# Patient Record
Sex: Male | Born: 1975 | Race: White | Hispanic: No | Marital: Married | Smoking: Current every day smoker
Health system: Southern US, Community
[De-identification: ages and names within clinical notes are randomized; demographics above are authoritative.]

## PROBLEM LIST (undated history)

## (undated) DIAGNOSIS — M549 Dorsalgia, unspecified: Secondary | ICD-10-CM

## (undated) HISTORY — PX: APPENDECTOMY: SHX54

## (undated) HISTORY — PX: SHOULDER SURGERY: SHX246

## (undated) HISTORY — PX: HERNIA REPAIR: SHX51

---

## 1997-11-30 ENCOUNTER — Emergency Department (HOSPITAL_COMMUNITY): Admission: EM | Admit: 1997-11-30 | Discharge: 1997-11-30 | Payer: Self-pay | Admitting: Emergency Medicine

## 2006-02-08 ENCOUNTER — Ambulatory Visit: Payer: Self-pay | Admitting: Family Medicine

## 2006-09-27 ENCOUNTER — Emergency Department (HOSPITAL_COMMUNITY): Admission: EM | Admit: 2006-09-27 | Discharge: 2006-09-27 | Payer: Self-pay | Admitting: Emergency Medicine

## 2006-09-27 ENCOUNTER — Emergency Department (HOSPITAL_COMMUNITY): Admission: EM | Admit: 2006-09-27 | Discharge: 2006-09-27 | Payer: Self-pay

## 2006-10-17 ENCOUNTER — Ambulatory Visit (HOSPITAL_COMMUNITY): Admission: RE | Admit: 2006-10-17 | Discharge: 2006-10-17 | Payer: Self-pay | Admitting: Orthopaedic Surgery

## 2006-12-21 ENCOUNTER — Ambulatory Visit (HOSPITAL_COMMUNITY): Admission: RE | Admit: 2006-12-21 | Discharge: 2006-12-21 | Payer: Self-pay | Admitting: Orthopaedic Surgery

## 2010-04-24 ENCOUNTER — Encounter: Payer: Self-pay | Admitting: Orthopaedic Surgery

## 2011-01-12 LAB — CBC
MCHC: 34.6
RDW: 12.7

## 2011-01-12 LAB — COMPREHENSIVE METABOLIC PANEL
ALT: 35
AST: 23
Calcium: 9.6
GFR calc Af Amer: >60
Sodium: 138
Total Protein: 6.7

## 2011-01-18 LAB — URINALYSIS, ROUTINE W REFLEX MICROSCOPIC
Bilirubin Urine: NEGATIVE
Glucose, UA: NEGATIVE
Ketones, ur: NEGATIVE
pH: 5.5

## 2011-01-18 LAB — I-STAT 8, (EC8 V) (CONVERTED LAB)
BUN: 23
Chloride: 113 — ABNORMAL HIGH
Glucose, Bld: 83
Hemoglobin: 13.3
Potassium: 5.6 — ABNORMAL HIGH
Sodium: 137

## 2011-01-18 LAB — CBC
HCT: 38.3 — ABNORMAL LOW
Hemoglobin: 13.5
RDW: 13.4

## 2011-01-18 LAB — DIFFERENTIAL
Basophils Absolute: 0
Eosinophils Relative: 1
Lymphocytes Relative: 23
Lymphs Abs: 2.8
Monocytes Absolute: 0.6
Monocytes Relative: 5

## 2011-01-18 LAB — BASIC METABOLIC PANEL
GFR calc non Af Amer: >60
Glucose, Bld: 87
Potassium: 4
Sodium: 142

## 2012-11-04 ENCOUNTER — Encounter: Payer: Self-pay | Admitting: Physician Assistant

## 2012-11-05 DIAGNOSIS — R079 Chest pain, unspecified: Secondary | ICD-10-CM

## 2013-02-20 ENCOUNTER — Emergency Department (HOSPITAL_COMMUNITY): Payer: Self-pay

## 2013-02-20 ENCOUNTER — Emergency Department (HOSPITAL_COMMUNITY)
Admission: EM | Admit: 2013-02-20 | Discharge: 2013-02-20 | Disposition: A | Discharge status: Home / Self Care | Payer: Self-pay | Attending: Emergency Medicine | Admitting: Emergency Medicine

## 2013-02-20 ENCOUNTER — Encounter (HOSPITAL_COMMUNITY): Payer: Self-pay | Admitting: Emergency Medicine

## 2013-02-20 DIAGNOSIS — S60229A Contusion of unspecified hand, initial encounter: Secondary | ICD-10-CM | POA: Insufficient documentation

## 2013-02-20 DIAGNOSIS — Y9389 Activity, other specified: Secondary | ICD-10-CM | POA: Insufficient documentation

## 2013-02-20 DIAGNOSIS — S61409A Unspecified open wound of unspecified hand, initial encounter: Secondary | ICD-10-CM | POA: Insufficient documentation

## 2013-02-20 DIAGNOSIS — S61411A Laceration without foreign body of right hand, initial encounter: Secondary | ICD-10-CM

## 2013-02-20 DIAGNOSIS — Y929 Unspecified place or not applicable: Secondary | ICD-10-CM | POA: Insufficient documentation

## 2013-02-20 DIAGNOSIS — F17200 Nicotine dependence, unspecified, uncomplicated: Secondary | ICD-10-CM | POA: Insufficient documentation

## 2013-02-20 DIAGNOSIS — S60221A Contusion of right hand, initial encounter: Secondary | ICD-10-CM

## 2013-02-20 DIAGNOSIS — Z8739 Personal history of other diseases of the musculoskeletal system and connective tissue: Secondary | ICD-10-CM | POA: Insufficient documentation

## 2013-02-20 DIAGNOSIS — IMO0002 Reserved for concepts with insufficient information to code with codable children: Secondary | ICD-10-CM | POA: Insufficient documentation

## 2013-02-20 HISTORY — DX: Dorsalgia, unspecified: M54.9

## 2013-02-20 MED ORDER — NAPROXEN 500 MG PO TABS: 500.0000 mg | ORAL_TABLET | Freq: Two times a day (BID) | ORAL | Status: AC

## 2013-02-20 MED ORDER — CEPHALEXIN 500 MG PO CAPS: 500.0000 mg | ORAL_CAPSULE | Freq: Three times a day (TID) | ORAL | Status: DC

## 2013-02-20 MED ORDER — DOUBLE ANTIBIOTIC 500-10000 UNIT/GM EX OINT
TOPICAL_OINTMENT | Freq: Once | CUTANEOUS | Status: AC
  Administered 2013-02-20: 1 via TOPICAL
  Filled 2013-02-20: qty 1

## 2013-02-20 MED ORDER — CEPHALEXIN 500 MG PO CAPS
500.0000 mg | ORAL_CAPSULE | Freq: Once | ORAL | Status: AC
  Administered 2013-02-20: 500 mg via ORAL
  Filled 2013-02-20: qty 1

## 2013-02-20 MED ORDER — OXYCODONE-ACETAMINOPHEN 5-325 MG PO TABS
1.0000 | ORAL_TABLET | Freq: Once | ORAL | Status: AC
  Administered 2013-02-20: 1 via ORAL
  Filled 2013-02-20: qty 1

## 2013-02-20 MED ORDER — HYDROCODONE-ACETAMINOPHEN 5-325 MG PO TABS: 1.0000 | ORAL_TABLET | ORAL | Status: DC | PRN

## 2013-02-20 MED ORDER — SILVER SULFADIAZINE 1 % EX CREA: TOPICAL_CREAM | Freq: Once | CUTANEOUS | Status: DC

## 2013-02-20 NOTE — ED Provider Notes (Signed)
CSN: [id number]     Arrival date & time 02/20/13  1108 History   First MD Initiated Contact with Patient 02/20/13 1115     Chief Complaint  Patient presents with  . Hand Pain   (Consider location/radiation/quality/duration/timing/severity/associated sxs/prior Treatment) Patient is a 37 y.o. male presenting with hand injury. The history is provided by the patient.  Hand Injury Location:  Hand Time since incident:  1 day Injury: yes   Hand location:  R hand Pain details:    Quality:  Aching and sharp   Severity:  Moderate   Onset quality:  Sudden   Progression:  Unchanged Chronicity:  New Handedness:  Right-handed Dislocation: no   Foreign body present:  Unable to specify Tetanus status:  Up to date Prior injury to area:  No Worsened by:  Movement Ineffective treatments:  None tried  Micheal Mendoza is a 37 y.o. male who presents to the ED with right hand swelling, laceration and tenderness. He was working on a car last night and a wrench slipped and he hit his hand on a piece of metal. This morning he has swelling and can not extend his little finger.  Past Medical History  Diagnosis Date  . Back pain    Past Surgical History  Procedure Laterality Date  . Shoulder surgery    . Appendectomy    . Hernia repair     No family history on file. History  Substance Use Topics  . Smoking status: Current Every Day Smoker    Types: Cigarettes  . Smokeless tobacco: Not on file  . Alcohol Use: No    Review of Systems Negative except as stated in HPI  Allergies  Review of patient's allergies indicates no known allergies.  Home Medications  No current outpatient prescriptions on file. BP 141/74  Pulse 76  Temp(Src) 97.4 F (36.3 C) (Oral)  Resp 16  Ht 6\' 2"  (1.88 m)  Wt 210 lb (95.255 kg)  BMI 26.95 kg/m2  SpO2 100% Physical Exam  Nursing note and vitals reviewed. Constitutional: He is oriented to person, place, and time. He appears well-developed and  well-nourished.  HENT:  Head: Normocephalic and atraumatic.  Eyes: Conjunctivae and EOM are normal.  Neck: Normal range of motion. Neck supple.  Cardiovascular: Normal rate.   Pulmonary/Chest: Effort normal.  Musculoskeletal:       Right hand: He exhibits decreased range of motion, tenderness, laceration and swelling. He exhibits normal capillary refill and no deformity. Decreased sensation noted. Decreased sensation is present in the ulnar distribution. Decreased strength noted.       Hands: Laceration to dorsum of hand near fifth digit. Swelling and tenderness with palpation. Difficulty with extension of the finger. Radial pulse strong, adequate circulation.  Neurological: He is alert and oriented to person, place, and time. A sensory deficit is present. No cranial nerve deficit.  Decreased sensation right little finger. Difficulty with extension of the finger.   Skin: Skin is warm and dry.  Psychiatric: He has a normal mood and affect. His behavior is normal.    ED Course: Dr. Jeraldine Loots in to examine the patient.   Procedures Consult with Dr. Hilda Lias and he will see the patient in the office tomorrow at 9 am  EKG Interpretation   None      MDM  37 y.o. male with pain, swelling and laceration to the dorsal aspect of the right hand. Difficulty with extension of the right little finger. Consider tendon injury. Ulnar gutter splint applied,  pain management, antibiotics and follow up with ortho in the morning. Sable for discharge without any immediate complications.  Discussed with the patient and all questioned fully answered   Medication List         cephALEXin 500 MG capsule  Commonly known as:  KEFLEX  Take 1 capsule (500 mg total) by mouth 3 (three) times daily.     HYDROcodone-acetaminophen 5-325 MG per tablet  Commonly known as:  NORCO/VICODIN  Take 1 tablet by mouth every 4 (four) hours as needed.     naproxen 500 MG tablet  Commonly known as:  NAPROSYN  Take 1 tablet  (500 mg total) by mouth 2 (two) times daily with a meal.         Janne Napoleon, NP 02/20/13 1724

## 2013-02-20 NOTE — ED Notes (Signed)
Patient soaking right hand in normal saline and peroxide.

## 2013-02-20 NOTE — ED Notes (Signed)
Injury to right hand last night.  C/o pain and swelling.

## 2013-02-20 NOTE — Discharge Instructions (Signed)
Your exam today shows that you may have injured the extensor tendon of the right hand at the little finger. Wear the splint, take the medication as directed, elevate the area and apply ice. Follow up with Dr. Hilda Lias in the morning at 9 am. Do not drive if you are taking the narcotic pain medication as it will make you sleepy.

## 2013-02-20 NOTE — ED Notes (Signed)
Patient hand cleaned with betadine.

## 2013-02-21 NOTE — ED Provider Notes (Signed)
  This was a shared visit with a mid-level provided (NP or PA).  Throughout the patient's course I was available for consultation/collaboration.  On my exam the patient was in no distress.  However, with his ongoing inability to fully extend the finger, one day after suffering injury, we discussed his care with our orthopedist to arrange next followup after immobilization here.      Gerhard Munch, MD 02/21/13 [phone]

## 2013-10-03 ENCOUNTER — Emergency Department (HOSPITAL_COMMUNITY): Payer: Self-pay

## 2013-10-03 ENCOUNTER — Encounter (HOSPITAL_COMMUNITY): Payer: Self-pay | Admitting: Emergency Medicine

## 2013-10-03 ENCOUNTER — Emergency Department (HOSPITAL_COMMUNITY)
Admission: EM | Admit: 2013-10-03 | Discharge: 2013-10-03 | Disposition: A | Discharge status: Home / Self Care | Payer: Self-pay | Attending: Emergency Medicine | Admitting: Emergency Medicine

## 2013-10-03 DIAGNOSIS — S61409A Unspecified open wound of unspecified hand, initial encounter: Secondary | ICD-10-CM | POA: Insufficient documentation

## 2013-10-03 DIAGNOSIS — W2203XA Walked into furniture, initial encounter: Secondary | ICD-10-CM | POA: Insufficient documentation

## 2013-10-03 DIAGNOSIS — S61411A Laceration without foreign body of right hand, initial encounter: Secondary | ICD-10-CM

## 2013-10-03 DIAGNOSIS — Z792 Long term (current) use of antibiotics: Secondary | ICD-10-CM | POA: Insufficient documentation

## 2013-10-03 DIAGNOSIS — F17200 Nicotine dependence, unspecified, uncomplicated: Secondary | ICD-10-CM | POA: Insufficient documentation

## 2013-10-03 DIAGNOSIS — Y929 Unspecified place or not applicable: Secondary | ICD-10-CM | POA: Insufficient documentation

## 2013-10-03 DIAGNOSIS — Y9389 Activity, other specified: Secondary | ICD-10-CM | POA: Insufficient documentation

## 2013-10-03 DIAGNOSIS — Z791 Long term (current) use of non-steroidal anti-inflammatories (NSAID): Secondary | ICD-10-CM | POA: Insufficient documentation

## 2013-10-03 MED ORDER — BACITRACIN ZINC 500 UNIT/GM EX OINT
TOPICAL_OINTMENT | CUTANEOUS | Status: AC
  Administered 2013-10-03: 1 via TOPICAL
  Filled 2013-10-03: qty 0.9

## 2013-10-03 MED ORDER — IBUPROFEN 400 MG PO TABS
400.0000 mg | ORAL_TABLET | Freq: Once | ORAL | Status: AC
  Administered 2013-10-03: 400 mg via ORAL
  Filled 2013-10-03: qty 1
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
## 2013-10-03 NOTE — ED Notes (Signed)
C/o headache, requesting pain med for same
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
## 2013-10-03 NOTE — Discharge Instructions (Signed)
Tissue Adhesive Wound Care °Some cuts, wounds, lacerations, and incisions can be repaired by using tissue adhesive. Tissue adhesive is like glue. It holds the skin together, allowing for faster healing. It forms a strong bond on the skin in about 1 minute and reaches its full strength in about 2 or 3 minutes. The adhesive disappears naturally while the wound is healing. It is important to take proper care of your wound at home while it heals.  °HOME CARE INSTRUCTIONS  °· Showers are allowed. Do not soak the area containing the tissue adhesive. Do not take baths, swim, or use hot tubs. Do not use any soaps or ointments on the wound. Certain ointments can weaken the glue. °· If a bandage (dressing) has been applied, follow your health care provider's instructions for how often to change the dressing.   °· Keep the dressing dry if one has been applied.   °· Do not scratch, pick, or rub the adhesive.   °· Do not place tape over the adhesive. The adhesive could come off when pulling the tape off.   °· Protect the wound from further injury until it is healed.   °· Protect the wound from sun and tanning bed exposure while it is healing and for several weeks after healing.   °· Only take over-the-counter or prescription medicines as directed by your health care provider.   °· Keep all follow-up appointments as directed by your health care provider. °SEEK IMMEDIATE MEDICAL CARE IF:  °· Your wound becomes red, swollen, hot, or tender.   °· You develop a rash after the glue is applied. °· You have increasing pain in the wound.   °· You have a red streak that goes away from the wound.   °· You have pus coming from the wound.   °· You have increased bleeding. °· You have a fever. °· You have shaking chills.   °· You notice a bad smell coming from the wound.   °· Your wound or adhesive breaks open.   °MAKE SURE YOU:  °· Understand these instructions. °· Will watch your condition. °· Will get help right away if you are not doing  well or get worse. °Document Released: 09/13/2000 Document Revised: 01/08/2013 Document Reviewed: 10/09/2012 °[employer]® Patient Information ©2015 [employer]. This information is not intended to replace advice given to you by your health care provider. Make sure you discuss any questions you have with your health care provider. ° °
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
## 2014-06-27 ENCOUNTER — Emergency Department (HOSPITAL_COMMUNITY)
Admission: EM | Admit: 2014-06-27 | Discharge: 2014-06-28 | Disposition: A | Discharge status: Other Acute Inpatient Hospital | Payer: Self-pay | Attending: Emergency Medicine | Admitting: Emergency Medicine

## 2014-06-27 ENCOUNTER — Encounter (HOSPITAL_COMMUNITY): Payer: Self-pay | Admitting: Emergency Medicine

## 2014-06-27 DIAGNOSIS — Z791 Long term (current) use of non-steroidal anti-inflammatories (NSAID): Secondary | ICD-10-CM | POA: Insufficient documentation

## 2014-06-27 DIAGNOSIS — F1210 Cannabis abuse, uncomplicated: Secondary | ICD-10-CM | POA: Insufficient documentation

## 2014-06-27 DIAGNOSIS — Z720 Tobacco use: Secondary | ICD-10-CM | POA: Insufficient documentation

## 2014-06-27 DIAGNOSIS — F32A Depression, unspecified: Secondary | ICD-10-CM

## 2014-06-27 DIAGNOSIS — Z792 Long term (current) use of antibiotics: Secondary | ICD-10-CM | POA: Insufficient documentation

## 2014-06-27 DIAGNOSIS — F1010 Alcohol abuse, uncomplicated: Secondary | ICD-10-CM | POA: Insufficient documentation

## 2014-06-27 DIAGNOSIS — F329 Major depressive disorder, single episode, unspecified: Secondary | ICD-10-CM | POA: Insufficient documentation

## 2014-06-27 DIAGNOSIS — F1310 Sedative, hypnotic or anxiolytic abuse, uncomplicated: Secondary | ICD-10-CM | POA: Insufficient documentation

## 2014-06-27 DIAGNOSIS — F1410 Cocaine abuse, uncomplicated: Secondary | ICD-10-CM | POA: Insufficient documentation
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
## 2014-06-27 NOTE — ED Notes (Signed)
[facility] due to statements made by patient to PD.  Patient states he has been drunk for 1-2 months; states drinks a case of beer a day.

## 2014-06-28 ENCOUNTER — Encounter (HOSPITAL_COMMUNITY): Payer: Self-pay | Admitting: *Deleted

## 2014-06-28 ENCOUNTER — Inpatient Hospital Stay (HOSPITAL_COMMUNITY)
Admission: AD | Admit: 2014-06-28 | Discharge: 2014-07-01 | DRG: 897 | Disposition: A | Discharge status: Home / Self Care | Payer: 59 | Source: Intra-hospital | Attending: Psychiatry | Admitting: Psychiatry

## 2014-06-28 ENCOUNTER — Emergency Department (HOSPITAL_COMMUNITY): Payer: Self-pay

## 2014-06-28 DIAGNOSIS — F339 Major depressive disorder, recurrent, unspecified: Secondary | ICD-10-CM | POA: Diagnosis present

## 2014-06-28 DIAGNOSIS — R45851 Suicidal ideations: Secondary | ICD-10-CM | POA: Diagnosis present

## 2014-06-28 DIAGNOSIS — F10129 Alcohol abuse with intoxication, unspecified: Secondary | ICD-10-CM | POA: Diagnosis present

## 2014-06-28 DIAGNOSIS — F1020 Alcohol dependence, uncomplicated: Secondary | ICD-10-CM

## 2014-06-28 DIAGNOSIS — Y903 Blood alcohol level of 60-79 mg/100 ml: Secondary | ICD-10-CM | POA: Diagnosis present

## 2014-06-28 DIAGNOSIS — F332 Major depressive disorder, recurrent severe without psychotic features: Secondary | ICD-10-CM | POA: Diagnosis not present

## 2014-06-28 DIAGNOSIS — F10229 Alcohol dependence with intoxication, unspecified: Principal | ICD-10-CM | POA: Diagnosis present

## 2014-06-28 DIAGNOSIS — F17210 Nicotine dependence, cigarettes, uncomplicated: Secondary | ICD-10-CM | POA: Diagnosis present

## 2014-06-28 DIAGNOSIS — F1590 Other stimulant use, unspecified, uncomplicated: Secondary | ICD-10-CM

## 2014-06-28 DIAGNOSIS — F1220 Cannabis dependence, uncomplicated: Secondary | ICD-10-CM | POA: Diagnosis present

## 2014-06-28 DIAGNOSIS — F419 Anxiety disorder, unspecified: Secondary | ICD-10-CM | POA: Diagnosis present

## 2014-06-28 LAB — CBC WITH DIFFERENTIAL/PLATELET
BASOS ABS: 0 10*3/uL (ref 0.0–0.1)
Basophils Relative: 0 % (ref 0–1)
Eosinophils Absolute: 0.3 10*3/uL (ref 0.0–0.7)
Eosinophils Relative: 3 % (ref 0–5)
HEMATOCRIT: 43 % (ref 39.0–52.0)
Hemoglobin: 14.8 g/dL (ref 13.0–17.0)
LYMPHS ABS: 3.6 10*3/uL (ref 0.7–4.0)
Lymphocytes Relative: 38 % (ref 12–46)
MCH: 32.7 pg (ref 26.0–34.0)
MCHC: 34.4 g/dL (ref 30.0–36.0)
MCV: 95.1 fL (ref 78.0–100.0)
MONO ABS: 0.7 10*3/uL (ref 0.1–1.0)
Monocytes Relative: 7 % (ref 3–12)
Neutro Abs: 4.8 10*3/uL (ref 1.7–7.7)
Neutrophils Relative %: 52 % (ref 43–77)
Platelets: 203 10*3/uL (ref 150–400)
RBC: 4.52 MIL/uL (ref 4.22–5.81)
RDW: 12.6 % (ref 11.5–15.5)
WBC: 9.3 10*3/uL (ref 4.0–10.5)

## 2014-06-28 LAB — COMPREHENSIVE METABOLIC PANEL
ALBUMIN: 4.1 g/dL (ref 3.5–5.2)
ALT: 17 U/L (ref 0–53)
AST: 19 U/L (ref 0–37)
Alkaline Phosphatase: 49 U/L (ref 39–117)
Anion gap: 10 (ref 5–15)
BILIRUBIN TOTAL: 0.4 mg/dL (ref 0.3–1.2)
BUN: 19 mg/dL (ref 6–23)
CALCIUM: 8.8 mg/dL (ref 8.4–10.5)
CO2: 22 mmol/L (ref 19–32)
CREATININE: 1.24 mg/dL (ref 0.50–1.35)
Chloride: 108 mmol/L (ref 96–112)
GFR, EST AFRICAN AMERICAN: 85 mL/min — AB (ref >90)
GFR, EST NON AFRICAN AMERICAN: 73 mL/min — AB (ref >90)
Glucose, Bld: 85 mg/dL (ref 70–99)
Potassium: 3.8 mmol/L (ref 3.5–5.1)
Sodium: 140 mmol/L (ref 135–145)
Total Protein: 7 g/dL (ref 6.0–8.3)

## 2014-06-28 LAB — RAPID URINE DRUG SCREEN, HOSP PERFORMED
AMPHETAMINES: NOT DETECTED
BARBITURATES: NOT DETECTED
Benzodiazepines: POSITIVE — AB
Cocaine: POSITIVE — AB
OPIATES: NOT DETECTED
TETRAHYDROCANNABINOL: POSITIVE — AB

## 2014-06-28 LAB — ETHANOL: Alcohol, Ethyl (B): 69 mg/dL — ABNORMAL HIGH (ref 0–9)

## 2014-06-28 MED ORDER — ALUM & MAG HYDROXIDE-SIMETH 200-200-20 MG/5ML PO SUSP: 30.0000 mL | ORAL | Status: DC | PRN

## 2014-06-28 MED ORDER — MAGNESIUM HYDROXIDE 400 MG/5ML PO SUSP: 30.0000 mL | Freq: Every day | ORAL | Status: DC | PRN

## 2014-06-28 MED ORDER — LORAZEPAM 1 MG PO TABS
2.0000 mg | ORAL_TABLET | Freq: Once | ORAL | Status: AC
  Administered 2014-06-28: 2 mg via ORAL
  Filled 2014-06-28: qty 2

## 2014-06-28 MED ORDER — LORAZEPAM 1 MG PO TABS: 1.0000 mg | ORAL_TABLET | Freq: Every day | ORAL | Status: DC

## 2014-06-28 MED ORDER — HYDROXYZINE HCL 25 MG PO TABS: 25.0000 mg | ORAL_TABLET | Freq: Four times a day (QID) | ORAL | Status: DC | PRN

## 2014-06-28 MED ORDER — THIAMINE HCL 100 MG/ML IJ SOLN: 100.0000 mg | Freq: Once | INTRAMUSCULAR | Status: DC

## 2014-06-28 MED ORDER — ZOLPIDEM TARTRATE 5 MG PO TABS: 5.0000 mg | ORAL_TABLET | Freq: Every evening | ORAL | Status: DC | PRN

## 2014-06-28 MED ORDER — LORAZEPAM 1 MG PO TABS
1.0000 mg | ORAL_TABLET | Freq: Three times a day (TID) | ORAL | Status: AC
  Administered 2014-06-30 – 2014-07-01 (×3): 1 mg via ORAL
  Filled 2014-06-28 (×3): qty 1

## 2014-06-28 MED ORDER — NICOTINE 21 MG/24HR TD PT24
21.0000 mg | MEDICATED_PATCH | Freq: Every day | TRANSDERMAL | Status: DC
  Administered 2014-06-28: 21 mg via TRANSDERMAL
  Filled 2014-06-28: qty 1

## 2014-06-28 MED ORDER — LORAZEPAM 1 MG PO TABS
0.0000 mg | ORAL_TABLET | Freq: Four times a day (QID) | ORAL | Status: DC
  Administered 2014-06-28: 2 mg via ORAL
  Administered 2014-06-28 (×2): 1 mg via ORAL
  Filled 2014-06-28: qty 1
  Filled 2014-06-28: qty 2
  Filled 2014-06-28: qty 1

## 2014-06-28 MED ORDER — VITAMIN B-1 100 MG PO TABS: 100.0000 mg | ORAL_TABLET | Freq: Every day | ORAL | Status: DC

## 2014-06-28 MED ORDER — VITAMIN B-1 100 MG PO TABS
100.0000 mg | ORAL_TABLET | Freq: Every day | ORAL | Status: DC
  Administered 2014-06-29 – 2014-07-01 (×3): 100 mg via ORAL
  Filled 2014-06-28 (×5): qty 1

## 2014-06-28 MED ORDER — LORAZEPAM 1 MG PO TABS
1.0000 mg | ORAL_TABLET | Freq: Four times a day (QID) | ORAL | Status: AC
  Administered 2014-06-28 – 2014-06-30 (×6): 1 mg via ORAL
  Filled 2014-06-28 (×6): qty 1

## 2014-06-28 MED ORDER — NAPROXEN 500 MG PO TABS
500.0000 mg | ORAL_TABLET | Freq: Two times a day (BID) | ORAL | Status: DC | PRN
  Administered 2014-06-29: 500 mg via ORAL
  Filled 2014-06-28: qty 1

## 2014-06-28 MED ORDER — TRAZODONE HCL 50 MG PO TABS
50.0000 mg | ORAL_TABLET | Freq: Every evening | ORAL | Status: DC | PRN
  Administered 2014-06-28 – 2014-06-30 (×3): 50 mg via ORAL
  Filled 2014-06-28 (×2): qty 1
  Filled 2014-06-28: qty 14

## 2014-06-28 MED ORDER — IBUPROFEN 400 MG PO TABS
600.0000 mg | ORAL_TABLET | Freq: Three times a day (TID) | ORAL | Status: DC | PRN
  Administered 2014-06-28: 600 mg via ORAL
  Filled 2014-06-28: qty 2

## 2014-06-28 MED ORDER — LORAZEPAM 1 MG PO TABS: 0.0000 mg | ORAL_TABLET | Freq: Two times a day (BID) | ORAL | Status: DC

## 2014-06-28 MED ORDER — ONDANSETRON HCL 4 MG PO TABS: 4.0000 mg | ORAL_TABLET | Freq: Three times a day (TID) | ORAL | Status: DC | PRN

## 2014-06-28 MED ORDER — ONDANSETRON 4 MG PO TBDP
ORAL_TABLET | ORAL | Status: AC
  Filled 2014-06-28: qty 1

## 2014-06-28 MED ORDER — ONDANSETRON 4 MG PO TBDP
4.0000 mg | ORAL_TABLET | Freq: Four times a day (QID) | ORAL | Status: DC | PRN
  Administered 2014-06-28 – 2014-06-29 (×2): 4 mg via ORAL
  Filled 2014-06-28: qty 1

## 2014-06-28 MED ORDER — ACETAMINOPHEN 325 MG PO TABS
650.0000 mg | ORAL_TABLET | ORAL | Status: DC | PRN
  Administered 2014-06-28: 650 mg via ORAL
  Filled 2014-06-28: qty 2

## 2014-06-28 MED ORDER — LORAZEPAM 1 MG PO TABS: 1.0000 mg | ORAL_TABLET | Freq: Two times a day (BID) | ORAL | Status: DC

## 2014-06-28 MED ORDER — TRAZODONE HCL 50 MG PO TABS
ORAL_TABLET | ORAL | Status: AC
  Filled 2014-06-28: qty 1

## 2014-06-28 MED ORDER — ACETAMINOPHEN 325 MG PO TABS: 650.0000 mg | ORAL_TABLET | Freq: Four times a day (QID) | ORAL | Status: DC | PRN

## 2014-06-28 MED ORDER — THIAMINE HCL 100 MG/ML IJ SOLN: 100.0000 mg | Freq: Every day | INTRAMUSCULAR | Status: DC

## 2014-06-28 MED ORDER — ADULT MULTIVITAMIN W/MINERALS CH
1.0000 | ORAL_TABLET | Freq: Every day | ORAL | Status: DC
  Administered 2014-06-29 – 2014-07-01 (×3): 1 via ORAL
  Filled 2014-06-28 (×5): qty 1

## 2014-06-28 MED ORDER — LOPERAMIDE HCL 2 MG PO CAPS: 2.0000 mg | ORAL_CAPSULE | ORAL | Status: DC | PRN

## 2014-06-28 MED ORDER — LORAZEPAM 1 MG PO TABS
1.0000 mg | ORAL_TABLET | Freq: Four times a day (QID) | ORAL | Status: DC | PRN
  Administered 2014-06-29: 1 mg via ORAL
  Filled 2014-06-28: qty 1

## 2014-06-28 MED ORDER — LORAZEPAM 1 MG PO TABS
ORAL_TABLET | ORAL | Status: AC
  Filled 2014-06-28: qty 1

## 2014-06-28 NOTE — ED Notes (Signed)
Pt c/o "being jittery" Dr Preston FleetingGlick notified, additional orders given
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
## 2014-06-28 NOTE — ED Notes (Signed)
Pt resting, resp even and non labored, sitter remains at bedside,  

## 2014-06-28 NOTE — BH Assessment (Signed)
Assessment Note  Micheal Mendoza is an 39 y.o. malewho reports being brought to [facility] by [facility] Police after he was caught banging on the door of Spouse's residence.  Patient reports experiencing SI at that time and denied current SI, plans or intent.  He reports feeling depressed because his Spouse put him out of the home.  Patient was orientated x3, mood "depressed", and his affect was depressed and sleepy.  The Patient reports consuming from 1 1/2 cases to 2 cases of beer per day.  He also reports smoking Cannabis daily of 8 to 10 joints, smoking crack cocaine 1 day every 2 weeks, taking 2 Xanax bars today, and buying opiates off the street 1x per week.  He reports last use of alcohol, Cannabis, and Xanax was yesterday.  Patient reports sleeping 3 hours per night because of racing thoughts, losing 30 unintentional pounds over 45 day period, The Patient reports being hospitalized 8 days in 2010 for SI with a plan to shoot himself  and then was sent to [facility] to detox from crack cocaine.  Patient reports having a current charge of cyber stalking his Spouse, unsure of court date.  The Patient reports wanting residential substance use treatment.   Axis I: Depressive Disorder NOS and Alcohol Use Disorder, severe, Stimulant Use Disorder, moderate, Cannabis Use Disorder, moderate Axis II: Deferred Axis IV: housing problems, other psychosocial or environmental problems, problems related to legal system/crime and problems with primary support group Axis V: 41-50 serious symptoms  Past Medical History:  Past Medical History  Diagnosis Date  . Back pain     Past Surgical History  Procedure Laterality Date  . Shoulder surgery    . Appendectomy    . Hernia repair      Family History: No family history on file.  Social History:  reports that he has been smoking Cigarettes.  He does not have any smokeless tobacco history on file. He reports that he drinks alcohol. He reports that he uses illicit drugs  (Marijuana).  Additional Social History:     CIWA: CIWA-Ar BP: 109/56 mmHg Pulse Rate: 72 Nausea and Vomiting: no nausea and no vomiting Tactile Disturbances: none Tremor: no tremor Auditory Disturbances: not present Paroxysmal Sweats: no sweat visible Visual Disturbances: not present Anxiety: mildly anxious Headache, Fullness in Head: very mild Agitation: normal activity Orientation and Clouding of Sensorium: oriented and can do serial additions CIWA-Ar Total: 2 COWS:    Allergies: No Known Allergies  Home Medications:  (Not in a hospital admission)  OB/GYN Status:  No LMP for male patient.  General Assessment Data Location of Assessment: [facility] ACT Assessment: Yes Is this a Tele or Face-to-Face Assessment?: Tele Assessment Is this an Initial Assessment or a Re-assessment for this encounter?: Initial Assessment Living Arrangements: Other (Comment) (Homeless) Can pt return to current living arrangement?: Yes Admission Status: Voluntary Is patient capable of signing voluntary admission?: Yes Transfer from: Other (Comment) (Patient is homeless) Referral Source: Self/Family/Friend  Medical Screening Exam [facility] Walk-in ONLY) Medical Exam completed: Yes  [facility] Crisis Care Plan Living Arrangements: Other (Comment) (Homeless) Name of Psychiatrist: None Name of Therapist: None  Education Status Is patient currently in school?: No Current Grade: N/A Highest grade of school patient has completed: N/A Name of school: N/A Contact person: N/A  Risk to self with the past 6 months Suicidal Ideation: No-Not Currently/Within Last 6 Months Suicidal Intent: No Is patient at risk for suicide?: No Suicidal Plan?: No Access to Means: No What  has been your use of drugs/alcohol within the last 12 months?: ETOH, Cannabis, Xanax, and Opiates Previous Attempts/Gestures: Yes How many times?: 1 Other Self Harm Risks: None Triggers for Past Attempts: Other (Comment) (Substance  use) Intentional Self Injurious Behavior: None Family Suicide History: Unknown Recent stressful life event(s): Other (Comment) (Separated from Spouse) Persecutory voices/beliefs?: No Depression: Yes Depression Symptoms: Despondent, Insomnia, Feeling worthless/self pity, Feeling angry/irritable Substance abuse history and/or treatment for substance abuse?: Yes Suicide prevention information given to non-admitted patients: Not applicable  Risk to Others within the past 6 months Homicidal Ideation: No Thoughts of Harm to Others: No Current Homicidal Intent: No Current Homicidal Plan: No Access to Homicidal Means: No Identified Victim: N/A History of harm to others?: No Assessment of Violence: None Noted Violent Behavior Description: None Does patient have access to weapons?: No Criminal Charges Pending?: Yes Describe Pending Criminal Charges: Cyber stalking Spouse Does patient have a court date: No  Psychosis Hallucinations: None noted Delusions: None noted  Mental Status Report Appearance/Hygiene: In hospital gown Eye Contact: Poor Motor Activity: Unsteady Speech: Logical/coherent Level of Consciousness: Other (Comment) (Sleepy) Mood: Depressed, Anxious Affect: Apathetic, Depressed Anxiety Level: Moderate Thought Processes: Relevant, Coherent Judgement: Impaired Orientation: Person, Place, Time Obsessive Compulsive Thoughts/Behaviors: None  Cognitive Functioning Concentration: Decreased Memory: Recent Intact, Remote Intact IQ: Average Insight: Poor Impulse Control: Poor Appetite: Poor Weight Loss: 30 (45 days) Weight Gain: 0 Sleep: Decreased Total Hours of Sleep: 3 Vegetative Symptoms: None  ADLScreening [facility](BHH Assessment Services) Patient's cognitive ability adequate to safely complete daily activities?: Yes Patient able to express need for assistance with ADLs?: Yes Independently performs ADLs?: Yes (appropriate for developmental age)  Prior Inpatient  Therapy Prior Inpatient Therapy: Yes Prior Therapy Dates: 2010 Prior Therapy Facilty/Provider(s): [facility] Reason for Treatment: SI wilth plan to shot self  Prior Outpatient Therapy Prior Outpatient Therapy: No Prior Therapy Dates: N/A Prior Therapy Facilty/Provider(s): N/A Reason for Treatment: N/A  ADL Screening (condition at time of admission) Patient's cognitive ability adequate to safely complete daily activities?: Yes Is the patient deaf or have difficulty hearing?: No Does the patient have difficulty seeing, even when wearing glasses/contacts?: No Does the patient have difficulty concentrating, remembering, or making decisions?: No Patient able to express need for assistance with ADLs?: Yes Does the patient have difficulty dressing or bathing?: No Independently performs ADLs?: Yes (appropriate for developmental age) Does the patient have difficulty walking or climbing stairs?: No Weakness of Legs: None Weakness of Arms/Hands: None  Home Assistive Devices/Equipment Home Assistive Devices/Equipment: None    Abuse/Neglect Assessment (Assessment to be complete while patient is alone) Physical Abuse: Denies Verbal Abuse: Denies Sexual Abuse: Denies Exploitation of patient/patient's resources: Denies Self-Neglect: Denies Values / Beliefs Cultural Requests During Hospitalization: None Spiritual Requests During Hospitalization: None   Advance Directives (For Healthcare) Does patient have an advance directive?: No Would patient like information on creating an advanced directive?: No - patient declined information    Additional Information 1:1 In Past 12 Months?: No CIRT Risk: No Elopement Risk: No Does patient have medical clearance?: Yes     Disposition:  Disposition Initial Assessment Completed for this Encounter: Yes Disposition of Patient: Inpatient treatment program Type of inpatient treatment program: Adult  On Site Evaluation by:   Reviewed with  Physician:    Dey-Johnson,Brandilee Pies 06/28/2014 3:57 AM

## 2014-06-28 NOTE — ED Provider Notes (Signed)
Patient now complaining of left shoulder pain has been present for months. Patient is awaiting placement by behavioral health. Will going get x-ray of the left shoulder.  Vanetta MuldersScott Rosellen Lichtenberger, MD 06/28/14 1115

## 2014-06-28 NOTE — ED Provider Notes (Signed)
7:18 PM  Pt accepted to behavioral health hospital per Dr. Jama Flavorsobos.  Micheal MawKristen N Athena Baltz, DO 06/28/14 1918

## 2014-06-28 NOTE — Tx Team (Signed)
Initial Interdisciplinary Treatment Plan   PATIENT STRESSORS: Marital or family conflict Substance abuse   PATIENT STRENGTHS: Average or above average intelligence Motivation for treatment/growth Supportive family/friends   PROBLEM LIST: Problem List/Patient Goals Date to be addressed Date deferred Reason deferred Estimated date of resolution  Substance abuse (ETOH) 06/28/14     Depression 06/28/14     "detox and get back with wife"                                           DISCHARGE CRITERIA:  Improved stabilization in mood, thinking, and/or behavior Motivation to continue treatment in a less acute level of care Withdrawal symptoms are absent or subacute and managed without 24-hour nursing intervention  PRELIMINARY DISCHARGE PLAN: Attend 12-step recovery group Return to previous living arrangement  PATIENT/FAMIILY INVOLVEMENT: This treatment plan has been presented to and reviewed with the patient, Micheal Mendoza.  The patient and family have been given the opportunity to ask questions and make suggestions.  Juliann ParesBowman, Riann Oman Elizabeth 06/28/2014, 11:50 PM

## 2014-06-28 NOTE — BH Assessment (Signed)
0310:  Consulted with Dr. Preston FleetingGlick about the Patient.  He reports the Patient consumes alcohol heavily and had an argument with his Spouse.  He reports Patient is expressing despair and a desire not to go on.    0316:  Scheduled tele-assessment.  [id number] Kitchen:  Tel-assessment completed  [id number]:  Consulted with Extender Maryjean Mornharles Kober; Per Maryjean Mornharles Kober Patient meets inpatient criteria needs detox and seek residential treatment.  0340:  Provided Patient's disposition to Dr. Preston FleetingGlick.

## 2014-06-28 NOTE — BHH Counselor (Signed)
Faxed out to following places with available beds for dual diagnosis:  [facility]  Zeus Marquis, M.S., LPCA, [place], [facility] Licensed Professional Counselor Associate  Triage Specialist  [facility]  Therapeutic Triage Services Phone: [phone] Fax: [phone]

## 2014-06-28 NOTE — ED Notes (Signed)
Pt presents to er with police department for further evaluation of detox and suicidal ideation. Pt states that he has been drinking for "days", is homeless at present, police department was called out due to pt banging on his wife's door, wanting to stay there tonight. Police department stated that pt made the statement that he wanted to kill himself in front of them, when asked by RN about suicidal thoughts, pt states "I don't care what happens to me", when asked about any type of plan, pt states "i have thought of over a 1000 ways to do it",

## 2014-06-28 NOTE — Progress Notes (Signed)
Admission note:  Pt is 39 year old Caucasion male admitted to the services of Dr. Jama Flavorsobos after drinking heavily for the last 2 1/2 months.  Pt's wife will not let him return home unless he stops drinking.  Pt reports a history of cocaine abuse but denies current use.  [facility]. Pt states that he drinks up to 2 1/5ths of liquor daily as well as 2 cases of beer.  Pt experiencing some moderate withdrawal symptoms during admission and wishes to be able to go to room as soon as possible.  Pt does state that he wants to be here and is motivated for treatment.

## 2014-06-29 DIAGNOSIS — F332 Major depressive disorder, recurrent severe without psychotic features: Secondary | ICD-10-CM

## 2014-06-29 MED ORDER — NICOTINE 21 MG/24HR TD PT24
21.0000 mg | MEDICATED_PATCH | Freq: Every day | TRANSDERMAL | Status: DC
  Administered 2014-06-29 – 2014-07-01 (×3): 21 mg via TRANSDERMAL
  Filled 2014-06-29 (×6): qty 1

## 2014-06-29 MED ORDER — BUSPIRONE HCL 5 MG PO TABS
5.0000 mg | ORAL_TABLET | Freq: Three times a day (TID) | ORAL | Status: DC
  Administered 2014-06-29 – 2014-07-01 (×6): 5 mg via ORAL
  Filled 2014-06-29 (×6): qty 1
  Filled 2014-06-29 (×2): qty 42
  Filled 2014-06-29: qty 1
  Filled 2014-06-29: qty 42
  Filled 2014-06-29 (×2): qty 1

## 2014-06-29 NOTE — Progress Notes (Signed)
Patient ID: Micheal AxonCharles W Redcay, male   DOB: 04/07/1975, 39 y.o.   MRN: [id number] Dr. Ivan Anchorsequested he sign in voluntarily. Explained process to client and he did sign the voluntary form, and filed form in chart.

## 2014-06-29 NOTE — BHH Group Notes (Signed)
   [facility] L[facility] Dis[facility] Note  06/29/2014  8:45 AM   Participation Quality: Alert, Appropriate and Oriented  Mood/Affect: Depressed and Flat, irritable  Depression Rating: 9  Anxiety Rating: 10  Thoughts of Suicide: Pt denies SI/HI  Will you contract for safety? Yes  Current AVH: Pt denies  Plan for Discharge/Comments: Pt attended discharge planning group and actively participated in group. CSW provided pt with today's workbook. Patient plans to return home with his family and [facility] [place].  [employer] Means: Pt reports access to transportation  Supports: Patient identified his family as supportive.  Samuella BruinKristin Venecia Mehl, MSW, [employer]CSWA Clinical Social Worker [facility] [phone]

## 2014-06-29 NOTE — Progress Notes (Signed)
Patient ID: Micheal Mendoza, male   DOB: 12-Feb-1976, 39 y.o.   MRN: [id number] D-At am med pass, reports he is shaking and afraid of being sick, and wanting what ever he can have to feel better. Routine meds included Librium, no prns given at this time.Completed self inventory and rated self at 9 for depression and a 10 for anxiety. A-Meds as ordered.Monitored for safety. Support offered. R-No further complaints. He is feeling better since having his Librium.

## 2014-06-29 NOTE — Progress Notes (Signed)
Reports some relief from Zofran but continues to feel anxious, tremulous but per his report less so than yesterday, and palms are clammy. Ativan given as ordered at noon. He asked to stay in bed rather than go to lunch since he is still feeling nauseous, and allowed to do so. Gave him a hot pack and extra blanket for complaint of chills.

## 2014-06-29 NOTE — Progress Notes (Signed)
Recreation Therapy Notes  Date: 03.28.2016 Time: 9:30am Location: [address] Group Room   Group Topic: Stress Management  Goal Area(s) Addresses:  Patient will actively participate in stress management techniques presented during session.   Behavioral Response: Did not attend.   Marykay Lexenise L Vinh Sachs, LRT/CTRS  Merrick Feutz L 06/29/2014 10:19 AM

## 2014-06-29 NOTE — Progress Notes (Signed)
Feeling better, ate some of his lunch and is sitting up on end of bed. Waiting for clothes to be cleaned before he showers, clothes now in dryer. Drinking good amount of fluids.

## 2014-06-29 NOTE — BHH Suicide Risk Assessment (Signed)
[facility]   Nursing information obtained from:  Patient Demographic factors:  Male, Caucasian, Low socioeconomic status, Unemployed Current Mental Status:  Suicidal ideation indicated by others Loss Factors:  Loss of significant relationship Historical Factors:  Prior suicide attempts, Family history of mental illness or substance abuse Risk Reduction Factors:  Responsible for children under 37 years of age, Sense of responsibility to family, Living with another person, especially a relative Total Time spent with patient: 45 minutes Principal Problem: Alcohol use disorder, severe, dependence Diagnosis:   Patient Active Problem List   Diagnosis Date Noted  . Cannabis use disorder, moderate, dependence [F12.20] 06/28/2014  . Alcohol use disorder, severe, dependence [F10.20] 06/28/2014    Class: Acute  . Major depressive disorder, recurrent episode [F33.9] 06/28/2014    Class: Acute  . Stimulant use disorder [F15.99] 06/28/2014     Continued Clinical Symptoms:  Alcohol Use Disorder Identification Test Final Score (AUDIT): 33 The "Alcohol Use Disorders Identification Test", Guidelines for Use in Primary Care, Second Edition.  World Science writerHealth Organization [facility](WHO). Score between 0-7:  no or low risk or alcohol related problems. Score between 8-15:  moderate risk of alcohol related problems. Score between 16-19:  high risk of alcohol related problems. Score 20 or above:  warrants further diagnostic evaluation for alcohol dependence and treatment.   CLINICAL FACTORS:   Patient is a 39 year old man. History of polysubstance dependence. States his drug of choice had been cocaine in the past, but more recently has been alcohol. States he had been able to stay sober x 1 year, and was doing well. After an argument with wife, where she asked him to leave ( this was  A few weeks ago) , started drinking heavily, sometimes more than a case of beer per day plus liquor. He has  been homeless and " staying on the streets basically". He reports he is now feeling better, but still has some distal tremors and a subjective feeling of being " jittery" and feeling flushed. Of note, has never had withdrawal seizures or DTs. He reports a history of some depression and of chronic anxiety, particularly excessive worrying. Dx- Alcohol Dependence, Alcohol Withdrawal, Cocaine Dependence in remission, Anxiety Disorder NOS Plan- Ativan detox protocol. We discussed SSRI trial but concerned about side effects. Agrees to Buspar trial for anxiety.   Musculoskeletal: Strength & Muscle Tone: within normal limits- mild distal tremors, but no diaphoresis or acute distress/restlessness  Gait & Station: normal Patient leans: N/A  Psychiatric Specialty Exam: Physical Exam  Review of Systems  Constitutional: Negative for fever and chills.  Respiratory: Negative for cough.   Cardiovascular: Negative for chest pain.  Gastrointestinal: Negative for vomiting, abdominal pain, diarrhea and blood in stool.  Genitourinary: Negative for dysuria, urgency and frequency.  Skin: Negative for rash.  Neurological: Negative for seizures and headaches.  Psychiatric/Behavioral: Positive for substance abuse. The patient is nervous/anxious.     Blood pressure 134/99, pulse 91, temperature 97.9 F (36.6 C), temperature source Oral, resp. rate 20, height 5\' 7"  (1.702 m), weight 208 lb (94.348 kg).Body mass index is 32.57 kg/(m^2).  General Appearance: Fairly Groomed  Patent attorneyye Contact::  Good  Speech:  Normal Rate  Volume:  Normal  Mood:  Anxious- minimizes depression at this time  Affect:  mildly anxious but reactive   Thought Process:  Goal Directed and Linear  Orientation:  Full (Time, Place, and Person)  Thought Content:  denies hallucinations, no delusions  Suicidal Thoughts:  No- at  present denies any suicidal or homicidal ideations, contracts for safety on unit   Homicidal Thoughts:  No  Memory:   recent and remote grossly intact   Judgement:  Fair  Insight:  Fair  Psychomotor Activity:  Normal- mild distal tremors noted, no restlessness or distress   Concentration:  Good  Recall:  Good  Fund of Knowledge:Good  Language: Good  Akathisia:  Negative  Handed:  Right  AIMS (if indicated):     Assets:  Desire for Improvement Resilience  Sleep:     Cognition: WNL  ADL's:  Impaired     COGNITIVE FEATURES THAT CONTRIBUTE TO RISK:  Closed-mindedness    SUICIDE RISK:   Moderate:  Frequent suicidal ideation with limited intensity, and duration, some specificity in terms of plans, no associated intent, good self-control, limited dysphoria/symptomatology, some risk factors present, and identifiable protective factors, including available and accessible social support.  PLAN OF CARE: Patient will be admitted to inpatient psychiatric unit for stabilization and safety. Will provide and encourage milieu participation. Provide medication management and maked adjustments as needed.  Will also provide medication management to minimize risk of alcohol withdrawal. Will follow daily.    Medical Decision Making:  Review of Psycho-Social Stressors (1), Review or order clinical lab tests (1), Established Problem, Worsening (2), Review of Medication Regimen & Side Effects (2) and Review of New Medication or Change in Dosage (2)  I certify that inpatient services furnished can reasonably be expected to improve the patient's condition.   Jebediah Macrae, Madaline Guthrie 06/29/2014, 3:38 PM

## 2014-06-29 NOTE — BHH Group Notes (Signed)
BHH LCSW Group Therapy 06/29/2014  1:15 pm  Type of Therapy: Group Therapy Participation Level: Active  Participation Quality: Attentive, Sharing and Supportive  Affect: Appropriate  Cognitive: Alert and Oriented  Insight: Developing/Improving and Engaged  Engagement in Therapy: Developing/Improving and Engaged  Modes of Intervention: Clarification, Confrontation, Discussion, Education, Exploration,  Limit-setting, Orientation, Problem-solving, Rapport Building, Dance movement psychotherapisteality Testing, Socialization and Support  Summary of Progress/Problems: Pt identified obstacles faced currently and processed barriers involved in overcoming these obstacles. Pt identified steps necessary for overcoming these obstacles and explored motivation (internal and external) for facing these difficulties head on. Pt further identified one area of concern in their lives and chose a goal to focus on for today. Patient discussed having relationship issues with his wife and identified his drinking as problematic. Patient reports that his goals are to strengthen his marriage and stop drinking. He identified anger as a trigger for both relational issues and using. Patient also discussed the importance of his faith. CSW and other group members provided emotional support and encouragement.  Samuella BruinKristin Neiko Trivedi, MSW, [employer]CSWA Clinical Social Worker [facility] [phone]
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
## 2014-06-30 DIAGNOSIS — F1020 Alcohol dependence, uncomplicated: Secondary | ICD-10-CM

## 2014-06-30 NOTE — Progress Notes (Signed)
[facility] Group Notes:  (Nursing/MHT/Case Management/Adjunct)  Date:  06/30/2014  Time:  12:06 AM  Type of Therapy:  Group Therapy  Participation Level:  Active  Participation Quality:  Appropriate  Affect:  Appropriate  Cognitive:  Appropriate  Insight:  Appropriate  Engagement in Group:  Engaged  Modes of Intervention:  Socialization and Support  Summary of Progress/Problems: Pt. Was engaged and had good insight in group discussion.  Pt. Stated photography and riding horses are some of the activities he likes to do when feeling well.  Sondra ComeWilson, Kirin Pastorino J 06/30/2014, 12:06 AM
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
## 2014-06-30 NOTE — Progress Notes (Signed)
[facility] MD Progress Note  06/30/2014 5:42 PM Micheal Mendoza  MRN:  [id number] Subjective:  Patient states he has been feeling " really good" and is wanting to be discharged soon. Denies alcohol withdrawal at this time.  Denies depression. States " All I want to do is get back to my wife and family so I can start a new sober life with them".  Objective : I have discussed case with treatment team and have met with patient. Patient visible on unit, going to groups, interacting with staff. He is  Focused on being discharged soon. States he has spoken with his wife on the phone and that she wants him to return home. Denies any depression, and states his mood is " fine", although he does present as vaguely irritable, particularly when his request to leave tonight not met. He did express understanding about importance of setting up a tangible, solid disposition plan /referral prior to discharge. He denies cravings at this time. I  Have encouraged him to consider campral as an added medication strategy to help maintain sobriety. Not interested at this time. No disruptive behaviors on unit. Visible on unit and interacting with select peers.  Currently no significant withdrawal symptoms, has subtle distal tremors, no diaphoresis, no acute distress or discomfort, no restlessness. Principal Problem: Alcohol use disorder, severe, dependence Diagnosis:   Patient Active Problem List   Diagnosis Date Noted  . Cannabis use disorder, moderate, dependence [F12.20] 06/28/2014  . Alcohol use disorder, severe, dependence [F10.20] 06/28/2014    Class: Acute  . Major depressive disorder, recurrent episode [F33.9] 06/28/2014    Class: Acute  . Stimulant use disorder [F15.99] 06/28/2014   Total Time spent with patient: 25 minutes    Past Medical History:  Past Medical History  Diagnosis Date  . Back pain     Past Surgical History  Procedure Laterality Date  . Shoulder surgery    . Appendectomy    . Hernia  repair     Family History: History reviewed. No pertinent family history. Social History:  History  Alcohol Use  . Yes     History  Drug Use  . Yes  . Special: Marijuana    Comment: last used 02/19/13    History   Social History  . Marital Status: Married    Spouse Name: N/A  . Number of Children: N/A  . Years of Education: N/A   Social History Main Topics  . Smoking status: Current Every Day Smoker    Types: Cigarettes  . Smokeless tobacco: Not on file  . Alcohol Use: Yes  . Drug Use: Yes    Special: Marijuana     Comment: last used 02/19/13  . Sexual Activity: Yes   Other Topics Concern  . None   Social History Narrative   Additional History:    Sleep: Good  Appetite:  Good   Assessment:   Musculoskeletal: Strength & Muscle Tone: within normal limits Gait & Station: normal Patient leans: N/A   Psychiatric Specialty Exam: Physical Exam  Review of Systems  Constitutional: Negative for fever and chills.  Respiratory: Negative for cough and shortness of breath.   Cardiovascular: Negative for chest pain.  Gastrointestinal: Negative for vomiting and melena.  Skin: Negative for rash.  Neurological: Negative for headaches.  Psychiatric/Behavioral: Positive for substance abuse.    Blood pressure 124/93, pulse 97, temperature 97.7 F (36.5 C), temperature source Oral, resp. rate 18, height _0  (1.702 m), weight 208 lb (94.348 kg).Body mass  index is 32.57 kg/(m^2).  General Appearance: Fairly Groomed  Engineer, water::  Good  Speech:  Normal Rate  Volume:  Normal  Mood:  euthymic, denies depression, does become vaguely irritable and angry at times, but no agitated or threatening behavior  Affect:  Appropriate  Thought Process:  Goal Directed and Linear  Orientation:  Full (Time, Place, and Person)  Thought Content:  denies hallucinations, no delusions expressed   Suicidal Thoughts:  No At this time denies any suicidal plan or intent and contracts for  safety on the unit  Homicidal Thoughts:  No  Memory: Recent and Remote grossly intact  Judgement:  Fair  Insight:  Fair  Psychomotor Activity:  Normal  Concentration:  Good  Recall:  Good  Fund of Knowledge:Good  Language: Good  Akathisia:  Negative  Handed:  Right  AIMS (if indicated):     Assets:  Desire for Improvement Physical Health Resilience  ADL's: improved   Cognition: WNL  Sleep:  Number of Hours: 6.75     Current Medications: Current Facility-Administered Medications  Medication Dose Route Frequency Provider Last Rate Last Dose  . acetaminophen (TYLENOL) tablet 650 mg  650 mg Oral Q6H PRN Harriet Butte, NP      . alum & mag hydroxide-simeth (MAALOX/MYLANTA) 200-200-20 MG/5ML suspension 30 mL  30 mL Oral Q4H PRN Harriet Butte, NP      . busPIRone (BUSPAR) tablet 5 mg  5 mg Oral TID Jenne Campus, MD   5 mg at 06/30/14 1655  . hydrOXYzine (ATARAX/VISTARIL) tablet 25 mg  25 mg Oral Q6H PRN Harriet Butte, NP      . loperamide (IMODIUM) capsule 2-4 mg  2-4 mg Oral PRN Harriet Butte, NP      . LORazepam (ATIVAN) tablet 1 mg  1 mg Oral Q6H PRN Harriet Butte, NP   1 mg at 06/29/14 1547  . LORazepam (ATIVAN) tablet 1 mg  1 mg Oral TID Harriet Butte, NP   1 mg at 06/30/14 1656   Followed by  . [START ON 07/01/2014] LORazepam (ATIVAN) tablet 1 mg  1 mg Oral BID Harriet Butte, NP       Followed by  . [START ON 07/03/2014] LORazepam (ATIVAN) tablet 1 mg  1 mg Oral Daily Harriet Butte, NP      . magnesium hydroxide (MILK OF MAGNESIA) suspension 30 mL  30 mL Oral Daily PRN Harriet Butte, NP      . multivitamin with minerals tablet 1 tablet  1 tablet Oral Daily Harriet Butte, NP   1 tablet at 06/30/14 0819  . naproxen (NAPROSYN) tablet 500 mg  500 mg Oral BID PRN Harriet Butte, NP   500 mg at 06/29/14 1512  . nicotine (NICODERM CQ - dosed in mg/24 hours) patch 21 mg  21 mg Transdermal Daily Jenne Campus, MD   21 mg at 06/30/14 0819  . ondansetron  (ZOFRAN-ODT) disintegrating tablet 4 mg  4 mg Oral Q6H PRN Harriet Butte, NP   4 mg at 06/29/14 1047  . thiamine (B-1) injection 100 mg  100 mg Intramuscular Once Harriet Butte, NP   100 mg at 06/28/14 2345  . thiamine (VITAMIN B-1) tablet 100 mg  100 mg Oral Daily Harriet Butte, NP   100 mg at 06/30/14 0819  . traZODone (DESYREL) tablet 50 mg  50 mg Oral QHS PRN Harriet Butte, NP   50 mg at  06/29/14 2110    Lab Results: No results found for this or any previous visit (from the past 72 hour(s)).  Physical Findings: AIMS: Facial and Oral Movements Muscles of Facial Expression: None, normal Lips and Perioral Area: None, normal Jaw: None, normal Tongue: None, normal,Extremity Movements Upper (arms, wrists, hands, fingers): None, normal Lower (legs, knees, ankles, toes): None, normal, Trunk Movements Neck, shoulders, hips: None, normal, Overall Severity Severity of abnormal movements (highest score from questions above): None, normal Incapacitation due to abnormal movements: None, normal Patient's awareness of abnormal movements (rate only patient's report): No Awareness, Dental Status Current problems with teeth and/or dentures?: No Does patient usually wear dentures?: No  CIWA:  CIWA-Ar Total: 0 COWS:      Assessment- at this time patient is improved , presents euthymic although vaguely irritable. He is focused on leaving /being discharged soon, but denies cravings. He is tolerating BZD detox protocol well. No significant alcohol withdrawal symptoms at this time except for mild/subtle distal tremors.  Treatment Plan Summary: Daily contact with patient to assess and evaluate symptoms and progress in treatment, Medication management, Plan continue inpatient treatment  and continue medication management as below  Buspar 5 mgrs TID Ativan Detox Protocol Trazodone  50 mgrs QHS PRN Insomnia    Medical Decision Making:  Established Problem, Stable/Improving (1), Review of  Psycho-Social Stressors (1) and Review of Medication Regimen & Side Effects (2)     COBOS, FERNANDO 06/30/2014, 5:42 PM
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
## 2014-06-30 NOTE — BHH Suicide Risk Assessment (Signed)
BHH INPATIENT:  Family/Significant Other Suicide Prevention Education  Suicide Prevention Education:  Education Completed, Micheal Mendoza, [phone] been identified by the patient as the family member/significant other with whom the patient will be residing, and identified as the person(s) who will aid the patient in the event of a mental health crisis (suicidal ideations/suicide attempt).  With written consent from the patient, the family member/significant other has been provided the following suicide prevention education, prior to the and/or following the discharge of the patient.  Wife advised of being familiar with SPE and knowing what to do in a crisis.  The suicide prevention education provided includes the following:  Suicide risk factors  Suicide prevention and interventions  National Suicide Hotline telephone number  [facility] assessment telephone number  [facility] Emergency Assistance 911  [facility] and/or Residential Mobile Crisis Unit telephone number  Request made of family/significant other to:  Remove weapons (e.g., guns, rifles, knives), all items previously/currently identified as safety concern.  Wife advised patient does not have access to weapons.    Remove drugs/medications (over-the-counter, prescriptions, illicit drugs), all items previously/currently identified as a safety concern.  The family member/significant other verbalizes understanding of the suicide prevention education information provided.  The family member/significant other agrees to remove the items of safety concern listed above.  Micheal Mendoza 06/30/2014, 10:20 AM

## 2014-06-30 NOTE — BHH Group Notes (Signed)
B[facility] Group Notes:  (Nursing/MHT/Case Management/Adjunct)  Date:  06/30/2014  Time:  0930  Type of Therapy:  Nurse Education  Participation Level:  Active  Participation Quality:  Appropriate  Affect:  Appropriate  Cognitive:  Appropriate  Insight:  Appropriate  Engagement in Group:  Engaged  Modes of Intervention:  Discussion and Education  Summary of Progress/Problems:  Micheal Mendoza, Micheal Mendoza 06/30/2014, 10:19 AM

## 2014-06-30 NOTE — Tx Team (Addendum)
Interdisciplinary Treatment Plan Update   Date Reviewed:  06/30/2014  Time Reviewed:  8:48 AM  Progress in Treatment:   Attending groups: Yes, patient is attending groups. Participating in groups: Yes, engages in group discussion. Taking medication as prescribed: Yes  Tolerating medication: Yes Family/Significant other contact made: Yes, collateral contact with wife. Patient understands diagnosis: Yes, patient understands diagnosis and need for treatment. Discussing patient identified problems/goals with staff: Yes, patient is able to express goals for treatment and discharge. Medical problems stabilized or resolved: Yes Denies suicidal/homicidal ideation: Yes Patient has not harmed self or others: Yes  For review of initial/current patient goals, please see plan of care.  Estimated Length of Stay:  1-2 days  Reasons for Continued Hospitalization:  Anxiety Depression Medication stabilization   New Problems/Goals identified:    Discharge Plan or Barriers:   Home with outpatient follow up with [facility] [place]  Additional Comments:  Continue medication stabilization  Patient and CSW reviewed patient's identified goals and treatment plan.  Patient verbalized understanding and agreed to treatment plan.   Attendees:  Patient:  06/30/2014 8:48 AM   Signature:  Sallyanne HaversF. Cobos, MD 06/30/2014 8:48 AM  Signature:   06/30/2014 8:48 AM  Signature:  Liborio NixonPatrice White, RN 06/30/2014 8:48 AM  Signature: Earl ManySara Twyman, RN 06/30/2014 8:48 AM  Signature:   06/30/2014 8:48 AM  Signature:  Juline PatchQuylle Laden Fieldhouse, LCSW 06/30/2014 8:48 AM  Signature:  Belenda CruiseKristin Drinkard, LCSW-A 06/30/2014 8:48 AM  Signature:  Leisa LenzValerie Enoch, Care Coordinator [facility] 06/30/2014 8:48 AM  Signature:   06/30/2014 8:48 AM  Signature:  06/30/2014  8:48 AM  Signature:   Onnie BoerJennifer Clark, RN [facility] 06/30/2014  8:48 AM  Signature:   06/30/2014  8:48 AM    Scribe for Treatment Team:   Juline PatchQuylle Willa Brocks,  06/30/2014 8:48 AM

## 2014-06-30 NOTE — Progress Notes (Signed)
D: Pt presents anxious on approach. Pt denies any suicidal/homicidal ideations. Pt rates depression 3/10. Anxiety 2/10. Pt denies having any withdrawal symptoms. Pt continues to take ativan for withdrawal symptoms (detox protocol). Pt reported that he is ready to return home to his wife and kids. Pt plans to f/u with Dr. Betti Cruzeddy. Pt reported that he slept well last night. Hygiene is appropriate for pt and situation. Pt compliant with attending groups and engages. Pt verbalized that he is tolerating his meds well.  A: Medications administered as ordered per MD. Verbal support given. Pt encouraged to attend groups. 15 minute checks performed for safety.  R: Pt receptive to tx. Per pt, he enjoys attending groups and have learned coping skills.

## 2014-06-30 NOTE — Progress Notes (Signed)
Patient ID: Lacinda AxonCharles W Babic, male   DOB: 1976/01/24, 39 y.o.   MRN: [id number] D: Client smiling reports "day been better" "I detoxxed, don't want to see another beer, I was sick" Client goal: "going home to wife and kids" Client says he has seven children, three live with him. A: Writer provided emotional support encouraged client to report any concerns. Staff will monitor q5615min for safety. R: Client is safe on the unit, attended group.

## 2014-06-30 NOTE — Progress Notes (Signed)
Pt reports he is doing better this evening.  He showed Clinical research associatewriter that his tremors are almost gone.  He states his nausea is gone and that he went to dinner this evening.  He is expecting to discharge by Wednesday.  He denies SI/HI/AV.  He plans to return home, work on his marriage, and stop drinking and using drugs.  Writer reviewed pt's meds with him. He has been pleasant and cooperative with staff this evening.  Pt makes his needs known to staff.  Support and encouragement offered.  Safety maintained with q15 minute checks.

## 2014-07-01 ENCOUNTER — Encounter (HOSPITAL_COMMUNITY): Payer: Self-pay | Admitting: Registered Nurse

## 2014-07-01 MED ORDER — TRAZODONE HCL 50 MG PO TABS: 50.0000 mg | ORAL_TABLET | Freq: Every evening | ORAL | Status: AC | PRN

## 2014-07-01 MED ORDER — NAPROXEN 500 MG PO TABS
500.0000 mg | ORAL_TABLET | Freq: Two times a day (BID) | ORAL | Status: DC
  Filled 2014-07-01 (×2): qty 28

## 2014-07-01 MED ORDER — BUSPIRONE HCL 5 MG PO TABS: 5.0000 mg | ORAL_TABLET | Freq: Three times a day (TID) | ORAL | Status: AC

## 2014-07-01 MED ORDER — ACAMPROSATE CALCIUM 333 MG PO TBEC: 666.0000 mg | DELAYED_RELEASE_TABLET | Freq: Three times a day (TID) | ORAL | Status: AC

## 2014-07-01 MED ORDER — ACAMPROSATE CALCIUM 333 MG PO TBEC
666.0000 mg | DELAYED_RELEASE_TABLET | Freq: Three times a day (TID) | ORAL | Status: DC
  Administered 2014-07-01: 666 mg via ORAL
  Filled 2014-07-01: qty 84
  Filled 2014-07-01 (×2): qty 2
  Filled 2014-07-01 (×2): qty 84
  Filled 2014-07-01: qty 2

## 2014-07-01 NOTE — BHH Suicide Risk Assessment (Signed)
[facility] Discharge Suicide Risk Assessment   Demographic Factors:  39 year old married , lives with wife and three children  Total Time spent with patient: 45 minutes  Musculoskeletal: Strength & Muscle Tone: within normal limits no tremors, no diaphoresis Gait & Station: normal Patient leans: N/A  Psychiatric Specialty Exam: Physical Exam  ROS  Blood pressure 128/91, pulse 82, temperature 98 F (36.7 C), temperature source Oral, resp. rate 16, height  (1.702 m), weight 208 lb (94.348 kg).Body mass index is 32.57 kg/(m^2).  General Appearance: improved grooming   Eye Contact::  Good  Speech:  Normal Rate  Volume:  Normal  Mood:  Euthymic  Affect:  Appropriate  Thought Process:  Goal Directed and Linear  Orientation:  Full (Time, Place, and Person)  Thought Content:  no hallucinations,no delusions  Suicidal Thoughts:  No- denies any thoughts of hurting self or anyone else   Homicidal Thoughts:  No  Memory:  recent and remote grossly intact   Judgement:  Other:  improved  Insight:  Present  Psychomotor Activity:  Normal- no psychomotor agitation, no restlessness, no tremors   Concentration:  Good  Recall:  Good  Fund of Knowledge:Good  Language: Good  Akathisia:  Negative  Handed:  Right  AIMS (if indicated):     Assets:  Communication Skills Desire for Improvement Housing Social Support  Sleep:  Number of Hours: 6.75  Cognition: WNL  ADL's:  Improved    Have you used any form of tobacco in the last 30 days? (Cigarettes, Smokeless Tobacco, Cigars, and/or Pipes): Yes  Has this patient used any form of tobacco in the last 30 days? (Cigarettes, Smokeless Tobacco, Cigars, and/or Pipes) Yes, A prescription for an FDA-approved tobacco cessation medication was offered at discharge and the patient refused  Mental Status Per Nursing Assessment::   On Admission:  Suicidal ideation indicated by others  Current Mental Status by Physician: At this time patient is improved ,  fuller range of affect, improved mood, denies depression, no SI or HI, no psychotic symptoms, future oriented. At this time no ongoing alcohol withdrawal symptoms and does not appear to be in any acute distress. Vitals stable.  Loss Factors: Marital discord   Historical Factors:  long history of alcohol dependence, history of suicide attempt years ago   Risk Reduction Factors:   Responsible for children under 72 years of age, Sense of responsibility to family, Positive social support and Positive coping skills or problem solving skills  Continued Clinical Symptoms:  At this time patient improved , mood euthymic, affect appropriate, no SI or HI, no psychotic symptoms, future oriented.  No withdrawal symptoms at this time. Of note, at this time patient wants to be prescribed CAMPRAL to address cravings, we have discussed rationale and side effect profile.  Cognitive Features That Contribute To Risk:  No gross cognitive deficits noted upon discharge. Is alert , attentive, and oriented x 3   Suicide Risk:  Mild   Principal Problem: Alcohol use disorder, severe, dependence Discharge Diagnoses:  Patient Active Problem List   Diagnosis Date Noted  . Cannabis use disorder, moderate, dependence [F12.20] 06/28/2014  . Alcohol use disorder, severe, dependence [F10.20] 06/28/2014    Class: Acute  . Major depressive disorder, recurrent episode [F33.9] 06/28/2014    Class: Acute  . Stimulant use disorder [F15.99] 06/28/2014    Follow-up Information    Follow up with [facility] On 07/02/2014.   Why:  Thursday, July 02, 2014 at hospital discharge clinic 7:45 AM -  10AM.  Referral number [id number]   Contact information:   [address] [place], [place]   [id number]  [phone]      Plan Of Care/Follow-up recommendations:  Activity:  as tolerated Diet:  Regular Tests:  NA Other:  See below   Is patient on multiple antipsychotic therapies at discharge:  No   Has Patient had three or more  failed trials of antipsychotic monotherapy by history:  No  Recommended Plan for Multiple Antipsychotic Therapies: NA  Patient is leaving unit in good spirits. Plans to go home. Follow up as above. Encouraged to go to [employer] meetings.   COBOS, FERNANDO 07/01/2014, 10:34 AM

## 2014-07-01 NOTE — Progress Notes (Signed)
Patient ID: Micheal Mendoza, male   DOB: [birth year], 39 y.o.   MRN: [id number]   [facility]. Pt [facility]. Pt was stable and appreciative. All papers and prescriptions were given and valuables returned. Verbal understanding expressed. Denies SI/HI and A/VH. Pt given opportunity to express concerns and ask questions.

## 2014-07-01 NOTE — Progress Notes (Signed)
[facility] Group Notes:  (Nursing/MHT/Case Management/Adjunct)  Date:  07/01/2014  Time:  12:22 AM  Type of Therapy:  Group Therapy  Participation Level:  Active  Participation Quality:  Appropriate  Affect:  Appropriate  Cognitive:  Appropriate  Insight:  Appropriate  Engagement in Group:  Engaged  Modes of Intervention:  Socialization and Support  Summary of Progress/Problems: Pt. Was engaged in group discussion on recovery.  Pt. Stated was ready for discharge and had a plan.  Pt. Stated he recovery involves laughing and feeling good.                            Sondra ComeWilson, Rosanne Wohlfarth J 07/01/2014, 12:22 AM

## 2014-07-01 NOTE — [facility] Group Notes (Signed)
[facility]   07/01/2014 11:18 AM    Participation Quality:  Appropraite  Mood/Affect:  Appropriate  Depression Rating:  0  Anxiety Rating:  0  Thoughts of Suicide:  No  Will you contract for safety?   NA  Current AVH:  No  Plan for Discharge/Comments:  Patient attended discharge planning group and actively participated in group. He reports doing great and looks forward to discharging home today.  [facility] will [facility] [place]. Suicide prevention education reviewed and SPE document provided.   Transportation Means: Patient has transportation.   Supports:  Patient has a support system.   Perla Echavarria, Joesph JulyQuylle Hairston
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
## 2014-07-01 NOTE — Progress Notes (Signed)
Patient ID: Micheal Mendoza, male   DOB: 07-Dec-1975, 39 y.o.   MRN: [id number]  Pt currently presents with a pleasant affect and behavior. Pt reports being "excited" about going home and states  "I'm not going to start drinking again, I can't, I almost died this time." Per self inventory, pt rates depression, hopelessness and anxiety at a 0. Pt's daily goal is "going home" and they intend to do so by "go home." Pt reports good sleep, concentration and appetite.   Pt provided with medications per providers orders. Pt's labs and vitals were monitored throughout the day. Pt supported emotionally and encouraged to express concerns and questions. Pt educated on medications and alcohol withdrawal.  Pt's safety ensured with 15 minute and environmental checks. Pt currently denies SI/HI and A/V hallucinations. Pt verbally agrees to seek staff if SI/HI or A/VH occurs and to consult with staff before acting on these thoughts. Pt to be discharged today. Pt writes "Thanks for helping me feel better and for all ya'll have done to make me feel better."

## 2014-07-01 NOTE — Progress Notes (Signed)
  [facility] Discharge [facility] :  Will you be returning to the same living situation after discharge:  Yes,  Patient is returning home. At discharge, do you have transportation home?: Yes,  Patient to be assisted with bus pass to [address] where he advised wife will meet him. Do you have the ability to pay for your medications: No.  Patient needs assistance with indigent medications   Release of information consent forms completed and in the chart;  Patient's signature needed at discharge.  Patient to Follow up at: Follow-up Information    Follow up with [facility] On 07/02/2014.   Why:  Thursday, July 02, 2014 at hospital discharge clinic 7:45 AM -10AM.  Referral number [phone]   Contact information:   [address] [place], [place]   [address]  [phone]      Patient denies SI/HI:Patient no longer endorsing SI/HI or other thoughts of self harm.  Safety Planning and Suicide Prevention discussed: .Reviewed with all patients during discharge planning group   Have you used any form of tobacco in the last 30 days? (Cigarettes, Smokeless Tobacco, Cigars, and/or Pipes): Yes  Has patient been referred to the Quitline?:.Patient declined referral to Quitline.   Wynn BankerHodnett, Sheriann Newmann Hairston 07/01/2014, 11:16 AM

## 2014-07-01 NOTE — Progress Notes (Signed)
Recreation Therapy Notes  Date: 03.30.2016 Time: 9:30am Location: [address] Group Room   Group Topic: Stress Management  Goal Area(s) Addresses:  Patient will actively participate in stress management techniques presented during session.   Behavioral Response: Did not attend.   Christinia Lambeth L Jenaye Rickert, LRT/CTRS  Paisyn Guercio L 07/01/2014 3:11 PM 

## 2014-07-06 NOTE — Progress Notes (Signed)
Patient Discharge Instructions:  After Visit Summary (AVS):   Faxed to:  07/06/14 Discharge Summary Note:   Faxed to:  07/06/14 Psychiatric Admission Assessment Note:   Faxed to:  07/06/14 Suicide Risk Assessment - Discharge Assessment:   Faxed to:  07/06/14 Faxed/Sent to the Next Level Care provider:  07/06/14  [facility] to [facility] @ [phone]  Jerelene ReddenSheena E [place], 07/06/2014, 1:19 PM
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
